# Patient Record
Sex: Male | Born: 1990 | Race: Black or African American | Hispanic: No | Marital: Single | Smoking: Never smoker
Health system: Southern US, Community
[De-identification: ages and names within clinical notes are randomized; demographics above are authoritative.]

## PROBLEM LIST (undated history)

## (undated) DIAGNOSIS — Z8709 Personal history of other diseases of the respiratory system: Secondary | ICD-10-CM

## (undated) DIAGNOSIS — R519 Headache, unspecified: Secondary | ICD-10-CM

## (undated) DIAGNOSIS — R51 Headache: Secondary | ICD-10-CM

## (undated) HISTORY — PX: HUMERUS FRACTURE SURGERY: SHX670

## (undated) HISTORY — PX: ANTERIOR CRUCIATE LIGAMENT REPAIR: SHX115

---

## 2016-02-03 ENCOUNTER — Encounter (HOSPITAL_COMMUNITY): Payer: Self-pay | Admitting: Emergency Medicine

## 2016-02-03 ENCOUNTER — Emergency Department (HOSPITAL_COMMUNITY)
Admission: EM | Admit: 2016-02-03 | Discharge: 2016-02-03 | Disposition: A | Discharge status: Home / Self Care | Payer: BC Managed Care – PPO | Attending: Emergency Medicine | Admitting: Emergency Medicine

## 2016-02-03 ENCOUNTER — Emergency Department (HOSPITAL_COMMUNITY): Payer: BC Managed Care – PPO

## 2016-02-03 DIAGNOSIS — Y998 Other external cause status: Secondary | ICD-10-CM | POA: Insufficient documentation

## 2016-02-03 DIAGNOSIS — Y9372 Activity, wrestling: Secondary | ICD-10-CM | POA: Diagnosis not present

## 2016-02-03 DIAGNOSIS — Y9289 Other specified places as the place of occurrence of the external cause: Secondary | ICD-10-CM | POA: Insufficient documentation

## 2016-02-03 DIAGNOSIS — S46202A Unspecified injury of muscle, fascia and tendon of other parts of biceps, left arm, initial encounter: Secondary | ICD-10-CM | POA: Diagnosis not present

## 2016-02-03 DIAGNOSIS — S4992XA Unspecified injury of left shoulder and upper arm, initial encounter: Secondary | ICD-10-CM | POA: Diagnosis present

## 2016-02-03 DIAGNOSIS — W1839XA Other fall on same level, initial encounter: Secondary | ICD-10-CM | POA: Insufficient documentation

## 2016-02-03 MED ORDER — NAPROXEN 500 MG PO TABS: 500.0000 mg | ORAL_TABLET | Freq: Two times a day (BID) | ORAL | Status: DC

## 2016-02-03 NOTE — Discharge Instructions (Signed)
Tendon Injury Tendons are strong, cordlike structures that connect muscle to bone. Tendons are made up of woven fibers, like a rope. A tendon injury is a tear (rupture) of the tendon. The rupture may be partial (only a few of the fibers in your tendon rupture) or complete (your entire tendon ruptures). CAUSES  Tendon injuries can be caused by high-stress activities, such as sports. They also can be caused by a repetitive injury or by a single injury from an excessive, rapid force. SYMPTOMS  Symptoms of tendon injury include pain when you move the joint close to the tendon. Other symptoms are swelling, redness, and warmth. DIAGNOSIS  Tendon injuries often can be diagnosed by physical exam. However, sometimes an X-ray exam or advanced imaging, such as magnetic resonance imaging (MRI), is necessary to determine the extent of the injury. TREATMENT  Partial tendon ruptures often can be treated with immobilization. A splint, bandage, or removable brace usually is used to immobilize the injured tendon. Most injured tendons need to be immobilized for 1-2 months before they are completely healed. Complete tendon ruptures may require surgical reattachment.   This information is not intended to replace advice given to you by your health care provider. Make sure you discuss any questions you have with your health care provider.   Document Released: 12/07/2004 Document Revised: 10/19/2011 Document Reviewed: 01/21/2012 Elsevier Interactive Patient Education 2016 Elsevier Inc[employer] Cryotherapy Cryotherapy means treatment with cold. Ice or gel packs can be used to reduce both pain and swelling. Ice is the most helpful within the first 24 to 48 hours after an injury or flare-up from overusing a muscle or joint. Sprains, strains, spasms, burning pain, shooting pain, and aches can all be eased with ice. Ice can also be used when recovering from surgery. Ice is effective, has very few side effects, and is safe for most  people to use. PRECAUTIONS  Ice is not a safe treatment option for people with:  Raynaud phenomenon. This is a condition affecting small blood vessels in the extremities. Exposure to cold may cause your problems to return.  Cold hypersensitivity. There are many forms of cold hypersensitivity, including:  Cold urticaria. Red, itchy hives appear on the skin when the tissues begin to warm after being iced.  Cold erythema. This is a red, itchy rash caused by exposure to cold.  Cold hemoglobinuria. Red blood cells break down when the tissues begin to warm after being iced. The hemoglobin that carry oxygen are passed into the urine because they cannot combine with blood proteins fast enough.  Numbness or altered sensitivity in the area being iced. If you have any of the following conditions, do not use ice until you have discussed cryotherapy with your caregiver:  Heart conditions, such as arrhythmia, angina, or chronic heart disease.  High blood pressure.  Healing wounds or open skin in the area being iced.  Current infections.  Rheumatoid arthritis.  Poor circulation.  Diabetes. Ice slows the blood flow in the region it is applied. This is beneficial when trying to stop inflamed tissues from spreading irritating chemicals to surrounding tissues. However, if you expose your skin to cold temperatures for too long or without the proper protection, you can damage your skin or nerves. Watch for signs of skin damage due to cold. HOME CARE INSTRUCTIONS Follow these tips to use ice and cold packs safely.  Place a dry or damp towel between the ice and skin. A damp towel will cool the skin more quickly, so you  may need to shorten the time that the ice is used.  For a more rapid response, add gentle compression to the ice.  Ice for no more than 10 to 20 minutes at a time. The bonier the area you are icing, the less time it will take to get the benefits of ice.  Check your skin after 5  minutes to make sure there are no signs of a poor response to cold or skin damage.  Rest 20 minutes or more between uses.  Once your skin is numb, you can end your treatment. You can test numbness by very lightly touching your skin. The touch should be so light that you do not see the skin dimple from the pressure of your fingertip. When using ice, most people will feel these normal sensations in this order: cold, burning, aching, and numbness.  Do not use ice on someone who cannot communicate their responses to pain, such as small children or people with dementia. HOW TO MAKE AN ICE PACK Ice packs are the most common way to use ice therapy. Other methods include ice massage, ice baths, and cryosprays. Muscle creams that cause a cold, tingly feeling do not offer the same benefits that ice offers and should not be used as a substitute unless recommended by your caregiver. To make an ice pack, do one of the following:  Place crushed ice or a bag of frozen vegetables in a sealable plastic bag. Squeeze out the excess air. Place this bag inside another plastic bag. Slide the bag into a pillowcase or place a damp towel between your skin and the bag.  Mix 3 parts water with 1 part rubbing alcohol. Freeze the mixture in a sealable plastic bag. When you remove the mixture from the freezer, it will be slushy. Squeeze out the excess air. Place this bag inside another plastic bag. Slide the bag into a pillowcase or place a damp towel between your skin and the bag. SEEK MEDICAL CARE IF:  You develop white spots on your skin. This may give the skin a blotchy (mottled) appearance.  Your skin turns blue or pale.  Your skin becomes waxy or hard.  Your swelling gets worse. MAKE SURE YOU:   Understand these instructions.  Will watch your condition.  Will get help right away if you are not doing well or get worse.   This information is not intended to replace advice given to you by your health care  provider. Make sure you discuss any questions you have with your health care provider.   Document Released: 06/26/2011 Document Revised: 11/20/2014 Document Reviewed: 06/26/2011 Elsevier Interactive Patient Education [employer]

## 2016-02-03 NOTE — ED Provider Notes (Signed)
CSN: [id number]     Arrival date & time 02/03/16  [id number] History  By signing my name below, I, Ronney LionSuzanne Le, attest that this documentation has been prepared under the direction and in the presence of Elpidio AnisShari Brookelynn Hamor, PA-C. Electronically Signed: Ronney LionSuzanne Le, ED Scribe. 02/03/2016. 10:15 AM.   Chief Complaint  Patient presents with  . Shoulder Injury   Patient is a [age] y.o. male presenting with shoulder injury. The history is provided by the patient. No language interpreter was used.  Shoulder Injury This is a new problem. The current episode started 12 to 24 hours ago. The problem occurs constantly. Pertinent negatives include no chest pain, no abdominal pain, no headaches and no shortness of breath. The symptoms are aggravated by bending and twisting. Nothing relieves the symptoms. He has tried nothing for the symptoms.   HPI Comments: Dan Keller is a 25 y.o. male who presents to the Emergency Department complaining of sudden-onset, constant, gradually worsening left shoulder pain that onset last night while patient was wrestling and fell on the ground, jamming his shoulder upwards. Patient states he did not take any treatments or medications for his symptoms. Patient states he had [place] in [place] in the past.    History reviewed. No pertinent past medical history. History reviewed. No pertinent past surgical history. No family history on file. Social History  Substance Use Topics  . Smoking status: Never Smoker   . Smokeless tobacco: None  . Alcohol Use: Yes    Review of Systems  Respiratory: Negative for shortness of breath.   Cardiovascular: Negative for chest pain.  Gastrointestinal: Negative for abdominal pain.  Musculoskeletal: Positive for arthralgias (left shoulder pain).  Neurological: Negative for headaches.      Allergies  Review of patient's allergies indicates no known allergies.  Home Medications   Prior to Admission medications   Not on File    BP 120/79 mmHg  Pulse 67  Temp(Src) 98.5 F (36.9 C) (Oral)  Resp 18  Ht 5\' 11"  (1.803 m)  Wt 186 lb (84.369 kg)  BMI 25.95 kg/m2  SpO2 97% Physical Exam  Constitutional: He is oriented to person, place, and time. He appears well-developed and well-nourished. No distress.  HENT:  Head: Normocephalic and atraumatic.  Eyes: Conjunctivae and EOM are normal.  Neck: Neck supple. No tracheal deviation present.  Cardiovascular: Normal rate and intact distal pulses.   Pulmonary/Chest: Effort normal. No respiratory distress.  Musculoskeletal: He exhibits tenderness.  Left shoulder has a palpable soft tissue deformity in the anterior proximal upper extremity. It is tender. He can extend the elbow. He cannot flex at the elbow. He has normal shoulder AC appearance, without tenderness. Distal pulses intact.   Neurological: He is alert and oriented to person, place, and time.  Skin: Skin is warm and dry.  Psychiatric: He has a normal mood and affect. His behavior is normal.  Nursing note and vitals reviewed.   ED Course  Procedures (including critical care time)  DIAGNOSTIC STUDIES: Oxygen Saturation is 97% on RA, normal by my interpretation.    COORDINATION OF CARE: 9:21 AM - Discussed treatment plan with pt at bedside which includes likely referral to orthopedist. Will discuss with attending physician. Pt verbalized understanding and agreed to plan.   9:45 AM - Discussed pt with attending physician Dr. Criss AlvineGoldston, who agrees with plan. Will consult with orthopedist.  Imaging Review No results found. I have personally reviewed and evaluated these images and lab results as part of  my medical decision-making.  MDM   Final diagnoses:  None   1. Left shoulder injury 2. Left biceps rupture/partial rupture  Discussed with Dr. Victorino Dike who advised obtaining plain film of shoulder based on mechanism of trauma. If negative, sling and refer to office to be seen in the next 1-2 days. If  positive, recommends CT of shoulder with office follow up.  Shoulder film negative for evidence of injury. Sling provided. They will contact Dr. Laverta Baltimore office for an appointment per recommendation.  I personally performed the services described in this documentation, which was scribed in my presence. The recorded information has been reviewed and is accurate.        Elpidio Anis, PA-C 02/03/16 1144  Pricilla Loveless, MD 02/06/16 [phone]

## 2016-02-03 NOTE — ED Notes (Signed)
Patient states was "wrestling, playing around" last night around midnight and he felt something tear in his L shoulder.  Patient does have a swollen area to proximal upper extremity.   Patient states didn't take anything for pain.

## 2016-02-07 ENCOUNTER — Other Ambulatory Visit: Payer: BC Managed Care – PPO

## 2016-02-07 ENCOUNTER — Other Ambulatory Visit: Payer: Self-pay | Admitting: Orthopedic Surgery

## 2016-02-07 ENCOUNTER — Ambulatory Visit
Admission: RE | Admit: 2016-02-07 | Discharge: 2016-02-07 | Disposition: A | Discharge status: Home / Self Care | Payer: BC Managed Care – PPO | Source: Ambulatory Visit | Attending: Orthopedic Surgery | Admitting: Orthopedic Surgery

## 2016-02-07 DIAGNOSIS — S46912A Strain of unspecified muscle, fascia and tendon at shoulder and upper arm level, left arm, initial encounter: Secondary | ICD-10-CM

## 2016-02-07 DIAGNOSIS — S29011A Strain of muscle and tendon of front wall of thorax, initial encounter: Secondary | ICD-10-CM

## 2016-02-15 NOTE — Pre-Procedure Instructions (Addendum)
    Dan Keller  02/15/2016      [facility] [id number] - [place], [place] - [address] [place] [id number] Phone: [phone] Fax: [phone]    Your procedure is scheduled on Thursday, April 6.  [facility] Ad[facility] at 9:55 A.M.                 Your surgery or procedure is scheduled for 11:55 AM    Call this number if you have problems the morning of surgery:[phone]   Remember:  Do not eat food or drink liquids after midnight.   Take these medicines the morning of surgery with A SIP OF WATER :None   Do not wear jewelry.  Do not wear lotions, powders, or colognes.    Men may shave face and neck.  Do not bring valuables to the hospital.  [facility] is not responsible for any belongings or valuables.  Contacts, dentures or bridgework may not be worn into surgery.  Leave your suitcase in the car.  After surgery it may be brought to your room.  For patients admitted to the hospital, discharge time will be determined by your treatment team.  Patients discharged the day of surgery will not be allowed to drive home.    Special instructions:  Review  [facility] - Preparing For Surgery.  Please read over the following fact sheets that you were given. Pain Booklet, Coughing and Deep Breathing and Surgical Site Infection Prevention and Incentive Spirometery

## 2016-02-16 ENCOUNTER — Encounter (HOSPITAL_COMMUNITY): Payer: Self-pay

## 2016-02-16 ENCOUNTER — Encounter (HOSPITAL_COMMUNITY)
Admission: RE | Admit: 2016-02-16 | Discharge: 2016-02-16 | Disposition: A | Discharge status: Home / Self Care | Payer: BC Managed Care – PPO | Source: Ambulatory Visit | Attending: Orthopedic Surgery | Admitting: Orthopedic Surgery

## 2016-02-16 DIAGNOSIS — S46812A Strain of other muscles, fascia and tendons at shoulder and upper arm level, left arm, initial encounter: Secondary | ICD-10-CM | POA: Diagnosis not present

## 2016-02-16 DIAGNOSIS — W19XXXA Unspecified fall, initial encounter: Secondary | ICD-10-CM | POA: Diagnosis not present

## 2016-02-16 DIAGNOSIS — Y9372 Activity, wrestling: Secondary | ICD-10-CM | POA: Diagnosis not present

## 2016-02-16 DIAGNOSIS — Z01812 Encounter for preprocedural laboratory examination: Secondary | ICD-10-CM | POA: Insufficient documentation

## 2016-02-16 HISTORY — DX: Headache: R51

## 2016-02-16 HISTORY — DX: Headache, unspecified: R51.9

## 2016-02-16 HISTORY — DX: Personal history of other diseases of the respiratory system: Z87.09

## 2016-02-16 LAB — CBC
HEMATOCRIT: 42.6 % (ref 39.0–52.0)
Hemoglobin: 15.2 g/dL (ref 13.0–17.0)
MCH: 30.8 pg (ref 26.0–34.0)
MCHC: 35.7 g/dL (ref 30.0–36.0)
MCV: 86.2 fL (ref 78.0–100.0)
Platelets: 195 10*3/uL (ref 150–400)
RBC: 4.94 MIL/uL (ref 4.22–5.81)
RDW: 12.3 % (ref 11.5–15.5)
WBC: 3.4 10*3/uL — AB (ref 4.0–10.5)

## 2016-02-16 NOTE — Progress Notes (Signed)
PCP - denies Cardiologist - denies   EKG/CXR - denies Echo/stress test/cardiac cath - denies  Patient denies chest pain and shortness of breath at PAT appointment.   

## 2016-02-17 ENCOUNTER — Ambulatory Visit (HOSPITAL_COMMUNITY)
Admission: RE | Admit: 2016-02-17 | Discharge: 2016-02-17 | Disposition: A | Discharge status: Home / Self Care | Payer: BC Managed Care – PPO | Source: Ambulatory Visit | Attending: Orthopedic Surgery | Admitting: Orthopedic Surgery

## 2016-02-17 ENCOUNTER — Ambulatory Visit (HOSPITAL_COMMUNITY): Payer: BC Managed Care – PPO | Admitting: Anesthesiology

## 2016-02-17 ENCOUNTER — Encounter (HOSPITAL_COMMUNITY): Payer: Self-pay | Admitting: *Deleted

## 2016-02-17 ENCOUNTER — Encounter (HOSPITAL_COMMUNITY)
Admission: RE | Disposition: A | Discharge status: Home / Self Care | Payer: Self-pay | Source: Ambulatory Visit | Attending: Orthopedic Surgery

## 2016-02-17 DIAGNOSIS — X58XXXA Exposure to other specified factors, initial encounter: Secondary | ICD-10-CM | POA: Insufficient documentation

## 2016-02-17 DIAGNOSIS — S46812A Strain of other muscles, fascia and tendons at shoulder and upper arm level, left arm, initial encounter: Secondary | ICD-10-CM | POA: Insufficient documentation

## 2016-02-17 HISTORY — PX: PECTORALIS TENDON REPAIR: SHX6510

## 2016-02-17 SURGERY — REPAIR, TENDON, PECTORALIS
Anesthesia: General | Site: Shoulder | Laterality: Left

## 2016-02-17 MED ORDER — FENTANYL CITRATE (PF) 250 MCG/5ML IJ SOLN
INTRAMUSCULAR | Status: AC
  Filled 2016-02-17: qty 5

## 2016-02-17 MED ORDER — BUPIVACAINE HCL (PF) 0.25 % IJ SOLN
INTRAMUSCULAR | Status: AC
  Filled 2016-02-17: qty 30

## 2016-02-17 MED ORDER — FENTANYL CITRATE (PF) 100 MCG/2ML IJ SOLN
INTRAMUSCULAR | Status: DC | PRN
  Administered 2016-02-17: 50 ug via INTRAVENOUS

## 2016-02-17 MED ORDER — ONDANSETRON HCL 4 MG PO TABS: 4.0000 mg | ORAL_TABLET | Freq: Three times a day (TID) | ORAL | Status: DC | PRN

## 2016-02-17 MED ORDER — ONDANSETRON HCL 4 MG/2ML IJ SOLN: 4.0000 mg | Freq: Once | INTRAMUSCULAR | Status: DC

## 2016-02-17 MED ORDER — ROCURONIUM BROMIDE 50 MG/5ML IV SOLN
INTRAVENOUS | Status: AC
  Filled 2016-02-17: qty 1

## 2016-02-17 MED ORDER — 0.9 % SODIUM CHLORIDE (POUR BTL) OPTIME
TOPICAL | Status: DC | PRN
  Administered 2016-02-17: 1000 mL

## 2016-02-17 MED ORDER — DIAZEPAM 5 MG PO TABS: 2.5000 mg | ORAL_TABLET | Freq: Four times a day (QID) | ORAL | Status: DC | PRN

## 2016-02-17 MED ORDER — GLYCOPYRROLATE 0.2 MG/ML IJ SOLN
INTRAMUSCULAR | Status: AC
  Filled 2016-02-17: qty 2

## 2016-02-17 MED ORDER — PROPOFOL 10 MG/ML IV BOLUS
INTRAVENOUS | Status: DC | PRN
  Administered 2016-02-17: 200 mg via INTRAVENOUS

## 2016-02-17 MED ORDER — ROCURONIUM BROMIDE 100 MG/10ML IV SOLN
INTRAVENOUS | Status: DC | PRN
  Administered 2016-02-17: 50 mg via INTRAVENOUS

## 2016-02-17 MED ORDER — LIDOCAINE HCL (CARDIAC) 20 MG/ML IV SOLN
INTRAVENOUS | Status: AC
  Filled 2016-02-17: qty 10

## 2016-02-17 MED ORDER — BUPIVACAINE-EPINEPHRINE (PF) 0.5% -1:200000 IJ SOLN
INTRAMUSCULAR | Status: DC | PRN
  Administered 2016-02-17: 30 mL via PERINEURAL

## 2016-02-17 MED ORDER — CHLORHEXIDINE GLUCONATE 4 % EX LIQD: 60.0000 mL | Freq: Once | CUTANEOUS | Status: DC

## 2016-02-17 MED ORDER — FENTANYL CITRATE (PF) 100 MCG/2ML IJ SOLN
INTRAMUSCULAR | Status: AC
  Administered 2016-02-17: 100 ug
  Filled 2016-02-17: qty 2

## 2016-02-17 MED ORDER — MIDAZOLAM HCL 2 MG/2ML IJ SOLN
INTRAMUSCULAR | Status: AC
  Administered 2016-02-17: 2 mg
  Filled 2016-02-17: qty 2

## 2016-02-17 MED ORDER — OXYCODONE-ACETAMINOPHEN 5-325 MG PO TABS: 1.0000 | ORAL_TABLET | ORAL | Status: DC | PRN

## 2016-02-17 MED ORDER — LIDOCAINE HCL (CARDIAC) 20 MG/ML IV SOLN
INTRAVENOUS | Status: DC | PRN
Start: 2016-02-17 — End: 2016-02-17
  Administered 2016-02-17: 80 mg via INTRAVENOUS

## 2016-02-17 MED ORDER — NEOSTIGMINE METHYLSULFATE 10 MG/10ML IV SOLN
INTRAVENOUS | Status: DC | PRN
  Administered 2016-02-17: 3 mg via INTRAVENOUS

## 2016-02-17 MED ORDER — GLYCOPYRROLATE 0.2 MG/ML IJ SOLN
INTRAMUSCULAR | Status: DC | PRN
  Administered 2016-02-17: 0.4 mg via INTRAVENOUS

## 2016-02-17 MED ORDER — CEFAZOLIN SODIUM-DEXTROSE 2-4 GM/100ML-% IV SOLN
2.0000 g | INTRAVENOUS | Status: AC
  Administered 2016-02-17: 2 g via INTRAVENOUS
  Filled 2016-02-17: qty 100

## 2016-02-17 MED ORDER — LACTATED RINGERS IV SOLN
INTRAVENOUS | Status: DC
  Administered 2016-02-17: 10:00:00 via INTRAVENOUS

## 2016-02-17 MED ORDER — PROPOFOL 10 MG/ML IV BOLUS
INTRAVENOUS | Status: AC
  Filled 2016-02-17: qty 20

## 2016-02-17 MED ORDER — MIDAZOLAM HCL 2 MG/2ML IJ SOLN
INTRAMUSCULAR | Status: AC
  Filled 2016-02-17: qty 2

## 2016-02-17 MED ORDER — ONDANSETRON HCL 4 MG/2ML IJ SOLN
INTRAMUSCULAR | Status: AC
  Filled 2016-02-17: qty 4

## 2016-02-17 MED ORDER — HYDROMORPHONE HCL 1 MG/ML IJ SOLN: 0.2500 mg | INTRAMUSCULAR | Status: DC | PRN

## 2016-02-17 MED ORDER — ONDANSETRON HCL 4 MG/2ML IJ SOLN
INTRAMUSCULAR | Status: DC | PRN
  Administered 2016-02-17: 4 mg via INTRAVENOUS

## 2016-02-17 SURGICAL SUPPLY — 66 items
BANDAGE ELASTIC 4 VELCRO ST LF (GAUZE/BANDAGES/DRESSINGS) IMPLANT
BNDG COHESIVE 4X5 TAN STRL (GAUZE/BANDAGES/DRESSINGS) ×3 IMPLANT
BNDG ESMARK 4X9 LF (GAUZE/BANDAGES/DRESSINGS) IMPLANT
BNDG GAUZE ELAST 4 BULKY (GAUZE/BANDAGES/DRESSINGS) IMPLANT
BUPIVACAINE 0.25% PL 30ML IMPLANT
CLOSURE STERI-STRIP 1/2X4 (GAUZE/BANDAGES/DRESSINGS) ×1
CLOSURE WOUND 1/2 X4 (GAUZE/BANDAGES/DRESSINGS) ×1
CLSR STERI-STRIP ANTIMIC 1/2X4 (GAUZE/BANDAGES/DRESSINGS) ×2 IMPLANT
COVER SURGICAL LIGHT HANDLE (MISCELLANEOUS) ×3 IMPLANT
CUFF TOURNIQUET SINGLE 34IN LL (TOURNIQUET CUFF) IMPLANT
CUFF TOURNIQUET SINGLE 44IN (TOURNIQUET CUFF) IMPLANT
DERMABOND ADVANCED (GAUZE/BANDAGES/DRESSINGS) ×2
DERMABOND ADVANCED .7 DNX12 (GAUZE/BANDAGES/DRESSINGS) ×1 IMPLANT
DRAPE INCISE IOBAN 66X45 STRL (DRAPES) ×3 IMPLANT
DRAPE ORTHO SPLIT 77X108 STRL (DRAPES) ×4
DRAPE SURG ORHT 6 SPLT 77X108 (DRAPES) ×2 IMPLANT
DRAPE U-SHAPE 47X51 STRL (DRAPES) ×3 IMPLANT
DRSG ADAPTIC 3X8 NADH LF (GAUZE/BANDAGES/DRESSINGS) ×3 IMPLANT
DRSG AQUACEL AG ADV 3.5X 6 (GAUZE/BANDAGES/DRESSINGS) ×3 IMPLANT
DURAPREP 26ML APPLICATOR (WOUND CARE) ×3 IMPLANT
ELECT CAUTERY BLADE 6.4 (BLADE) ×3 IMPLANT
ELECT REM PT RETURN 9FT ADLT (ELECTROSURGICAL) ×3
ELECTRODE REM PT RTRN 9FT ADLT (ELECTROSURGICAL) ×1 IMPLANT
GAUZE SPONGE 4X4 12PLY STRL (GAUZE/BANDAGES/DRESSINGS) IMPLANT
GLOVE BIO SURGEON STRL SZ7.5 (GLOVE) ×3 IMPLANT
GLOVE BIO SURGEON STRL SZ8 (GLOVE) ×3 IMPLANT
GLOVE EUDERMIC 7 POWDERFREE (GLOVE) ×3 IMPLANT
GLOVE SS BIOGEL STRL SZ 7.5 (GLOVE) ×1 IMPLANT
GLOVE SUPERSENSE BIOGEL SZ 7.5 (GLOVE) ×2
GOWN STRL REUS W/ TWL LRG LVL3 (GOWN DISPOSABLE) ×1 IMPLANT
GOWN STRL REUS W/ TWL XL LVL3 (GOWN DISPOSABLE) ×2 IMPLANT
GOWN STRL REUS W/TWL LRG LVL3 (GOWN DISPOSABLE) ×2
GOWN STRL REUS W/TWL XL LVL3 (GOWN DISPOSABLE) ×4
IMPL KIT LG PECW/BUTTON 3PK (Orthopedic Implant) ×1 IMPLANT
IMPLANT KIT LG PECW/BUTTON 3PK (Orthopedic Implant) ×3 IMPLANT
KIT BASIN OR (CUSTOM PROCEDURE TRAY) ×3 IMPLANT
KIT ROOM TURNOVER OR (KITS) ×3 IMPLANT
MANIFOLD NEPTUNE II (INSTRUMENTS) ×3 IMPLANT
NEEDLE 22X1 1/2 (OR ONLY) (NEEDLE) IMPLANT
NS IRRIG 1000ML POUR BTL (IV SOLUTION) ×3 IMPLANT
PACK ORTHO EXTREMITY (CUSTOM PROCEDURE TRAY) ×3 IMPLANT
PAD ARMBOARD 7.5X6 YLW CONV (MISCELLANEOUS) ×6 IMPLANT
PAD CAST 3X4 CTTN HI CHSV (CAST SUPPLIES) IMPLANT
PADDING CAST COTTON 3X4 STRL (CAST SUPPLIES)
PASSER SUT SWANSON 36MM LOOP (INSTRUMENTS) ×6 IMPLANT
SLING ARM FOAM STRAP LRG (SOFTGOODS) ×3 IMPLANT
SPONGE LAP 4X18 X RAY DECT (DISPOSABLE) ×6 IMPLANT
STAPLER VISISTAT 35W (STAPLE) IMPLANT
STOCKINETTE IMPERVIOUS 9X36 MD (GAUZE/BANDAGES/DRESSINGS) ×3 IMPLANT
STRIP CLOSURE SKIN 1/2X4 (GAUZE/BANDAGES/DRESSINGS) ×2 IMPLANT
SUCTION FRAZIER HANDLE 10FR (MISCELLANEOUS) ×2
SUCTION TUBE FRAZIER 10FR DISP (MISCELLANEOUS) ×1 IMPLANT
SUT ETHIBOND 2 OS 4 DA (SUTURE) ×3 IMPLANT
SUT FIBERWIRE #2 38 T-5 BLUE (SUTURE) ×3
SUT MNCRL AB 3-0 PS2 18 (SUTURE) ×3 IMPLANT
SUT MON AB 2-0 CT1 36 (SUTURE) ×3 IMPLANT
SUT VIC AB 2-0 CT1 27 (SUTURE) ×2
SUT VIC AB 2-0 CT1 TAPERPNT 27 (SUTURE) ×1 IMPLANT
SUTURE FIBERWR #2 38 T-5 BLUE (SUTURE) ×1 IMPLANT
SYR CONTROL 10ML LL (SYRINGE) IMPLANT
TOWEL OR 17X24 6PK STRL BLUE (TOWEL DISPOSABLE) ×3 IMPLANT
TOWEL OR 17X26 10 PK STRL BLUE (TOWEL DISPOSABLE) ×3 IMPLANT
TUBE CONNECTING 12'X1/4 (SUCTIONS) ×1
TUBE CONNECTING 12X1/4 (SUCTIONS) ×2 IMPLANT
WATER STERILE IRR 1000ML POUR (IV SOLUTION) ×3 IMPLANT
YANKAUER SUCT BULB TIP NO VENT (SUCTIONS) IMPLANT

## 2016-02-17 NOTE — Transfer of Care (Signed)
Immediate Anesthesia Transfer of Care Note  Patient: Dan Keller  Procedure(s) Performed: Procedure(s): LEFT PECTORALIS TENDON REPAIR (Left)  Patient Location: PACU  Anesthesia Type:GA combined with regional for post-op pain  Level of Consciousness: awake, alert  and oriented  Airway & Oxygen Therapy: Patient Spontanous Breathing and Patient connected to nasal cannula oxygen  Post-op Assessment: Report given to RN, Post -op Vital signs reviewed and stable and Patient moving all extremities  Post vital signs: Reviewed and stable  Last Vitals:  Filed Vitals:   02/17/16 1050 02/17/16 1100  BP:    Pulse:    Temp:    Resp: 16 20    Complications: No apparent anesthesia complications

## 2016-02-17 NOTE — Discharge Instructions (Signed)
You may shower with current dressing in place, it is waterproof. Leave it in place until follow up appointment Ok to allow arm to dangle and to move it gently for hygiene. Sling on but can remove to allow arm to dangle and move elbow wrist and hand.    Call [phone] for any questions or concerns

## 2016-02-17 NOTE — Op Note (Signed)
02/17/2016  12:57 PM  PATIENT:   Dan Keller  25 y.o. male  PRE-OPERATIVE DIAGNOSIS:  left pectoralis tendon rupture  POST-OPERATIVE DIAGNOSIS:  same  PROCEDURE:  L pectoralis major tendon repair  SURGEON:  Yadir Zentner, Vania ReaKevin M. M.D.  ASSISTANTS: Shuford pac   ANESTHESIA:   GET + ISB  EBL: min  SPECIMEN:  none  Drains: none   PATIENT DISPOSITION:  PACU - hemodynamically stable.    PLAN OF CARE: Discharge to home after PACU  Dictation# [id number]   Contact # [phone]

## 2016-02-17 NOTE — Anesthesia Procedure Notes (Addendum)
Anesthesia Regional Block:  Interscalene brachial plexus block  Pre-Anesthetic Checklist: ,, timeout performed, Correct Patient, Correct Site, Correct Laterality, Correct Procedure, Correct Position, site marked, Risks and benefits discussed,  Surgical consent,  Pre-op evaluation,  At surgeon's request and post-op pain management  Laterality: Upper and Left  Prep: chloraprep and alcohol swabs       Needles:  Injection technique: Single-shot  Needle Type: Stimulator Needle - 40     Needle Length: 4cm 4 cm Needle Gauge: 22 and 22 G  Needle insertion depth: 3 cm   Additional Needles:  Procedures: ultrasound guided (picture in chart) and nerve stimulator Interscalene brachial plexus block  Nerve Stimulator or Paresthesia:  Response: Twitch elicited, 0.5 mA, 0.3 ms,   Additional Responses:   Narrative:  Start time: 02/17/2016 10:45 AM End time: 02/17/2016 11:05 AM Injection made incrementally with aspirations every 5 mL.  Performed by: Personally  Anesthesiologist: MASSAGEE, TERRY  Additional Notes: Block assessed prior to start of surgery   Procedure Name: Intubation Date/Time: 02/17/2016 11:34 AM Performed by: Sharlene DoryWALKER, Bleu Minerd E Pre-anesthesia Checklist: Patient identified, Emergency Drugs available, Suction available, Patient being monitored and Timeout performed Patient Re-evaluated:Patient Re-evaluated prior to inductionOxygen Delivery Method: Circle system utilized Preoxygenation: Pre-oxygenation with 100% oxygen Intubation Type: IV induction Ventilation: Mask ventilation without difficulty Laryngoscope Size: Mac and 4 Grade View: Grade I Tube type: Oral Tube size: 7.5 mm Number of attempts: 1 Airway Equipment and Method: Stylet Placement Confirmation: ETT inserted through vocal cords under direct vision,  positive ETCO2 and breath sounds checked- equal and bilateral Secured at: 22 cm Tube secured with: Tape Dental Injury: Teeth and Oropharynx as per pre-operative  assessment

## 2016-02-17 NOTE — Anesthesia Preprocedure Evaluation (Addendum)
Anesthesia Evaluation  Patient identified by MRN, date of birth, ID band Patient awake    Reviewed: Allergy & Precautions, NPO status , Patient's Chart, lab work & pertinent test results  History of Anesthesia Complications Negative for: history of anesthetic complications  Airway Mallampati: I       Dental  (+) Teeth Intact   Pulmonary neg pulmonary ROS,    breath sounds clear to auscultation       Cardiovascular negative cardio ROS   Rhythm:Regular Rate:Normal     Neuro/Psych negative neurological ROS  negative psych ROS   GI/Hepatic negative GI ROS, Neg liver ROS,   Endo/Other  negative endocrine ROS  Renal/GU negative Renal ROS  negative genitourinary   Musculoskeletal negative musculoskeletal ROS (+)   Abdominal   Peds negative pediatric ROS (+)  Hematology negative hematology ROS (+)   Anesthesia Other Findings   Reproductive/Obstetrics negative OB ROS                             Anesthesia Physical Anesthesia Plan  ASA: I  Anesthesia Plan: General   Post-op Pain Management: GA combined w/ Regional for post-op pain   Induction: Intravenous  Airway Management Planned: Oral ETT  Additional Equipment:   Intra-op Plan:   Post-operative Plan: Extubation in OR  Informed Consent:   Dental advisory given  Plan Discussed with: CRNA and Surgeon  Anesthesia Plan Comments:         Anesthesia Quick Evaluation

## 2016-02-17 NOTE — H&P (Signed)
Dan Keller    Chief Complaint: left pectoralis tendon rupture HPI: The patient is a 25 y.o. male with an acute left pectoralis major tendon rupture  Past Medical History  Diagnosis Date  . History of bronchitis   . Headache     "occasionally - not recently" 02/16/16    Past Surgical History  Procedure Laterality Date  . Anterior cruciate ligament repair Left   . Humerus fracture surgery Right     History reviewed. No pertinent family history.  Social History:  reports that he has never smoked. He does not have any smokeless tobacco history on file. He reports that he uses illicit drugs (Marijuana). He reports that he does not drink alcohol.   Medications Prior to Admission  Medication Sig Dispense Refill  . naproxen (NAPROSYN) 500 MG tablet Take 500 mg by mouth 2 (two) times daily with a meal.       Physical Exam: left shoulder with loss of pec major contour and weakness with internal rotation  Vitals  Temp:  [98.4 F (36.9 C)] 98.4 F (36.9 C) (04/06 1003) Pulse Rate:  [58] 58 (04/06 1034) Resp:  [18] 18 (04/06 1034) BP: (127)/(77) 127/77 mmHg (04/06 1003) SpO2:  [99 %-100 %] 100 % (04/06 1034) Weight:  [84.114 kg (185 lb 7 oz)] 84.114 kg (185 lb 7 oz) (04/06 0947)  Assessment/Plan  Impression: left pectoralis tendon rupture  Plan of Action: Procedure(s): LEFT PECTORALIS TENDON REPAIR  Avory Rahimi M Dontavius Keim 02/17/2016, 10:56 AM Contact # [phone]

## 2016-02-18 ENCOUNTER — Encounter (HOSPITAL_COMMUNITY): Payer: Self-pay | Admitting: Orthopedic Surgery

## 2016-02-18 NOTE — Op Note (Signed)
NAMMarland Kitchen:  Dan Keller, Dan Keller               ACCOUNT NO.:  [id number]  MEDICAL RECORD NO.:  [id number]  LOCATION:  [facility]                         FACILITY:  [facility]  PHYSICIAN:  Vania ReaKevin M. Canda Podgorski, M.D.  DATE OF BIRTH:  [birth year]  DATE OF PROCEDURE:  02/17/2016 DATE OF DISCHARGE:  02/17/2016                              OPERATIVE REPORT   PREOPERATIVE DIAGNOSIS:  Left pectoralis major tendon rupture.  POSTOPERATIVE DIAGNOSIS:  Left pectoralis major tendon rupture.  PROCEDURE:  Open left pectoralis major tendon repair.  SURGEON:  Vania ReaKevin M. Giovannie Scerbo, M.D.  Threasa HeadsASSISTANFrench Ana:  Tracy A. Shuford, P.A.-C.  ANESTHESIA:  General endotracheal as well as an interscalene block.  ESTIMATED BLOOD LOSS:  Minimal.  DRAINS:  None.  HISTORY:  Dan Keller is a 25 year old male, who injured his left shoulder with complaints of pain, weakness and muscular deformity on anterolateral aspect of his left chest wall in the region of the anterior axillary fold.  On evaluation in our office, known to have some asymmetry of the pectoralis major muscle belly contours with change in the contour of the axillary fold, local tenderness about the anterior aspect of the shoulder and significant pain and weakness with attempts at resisted internal rotation.  A subsequent chest wall MRI scan confirmed a rupture of the pectoralis major tendon insertion distally. He was brought to the operating room at this time for planned pectoralis major tendon repair.  Preoperatively, I counseled Dan Keller regarding treatment options and potential risks versus benefits thereof.  Possible surgical complications were reviewed including bleeding, infection, vascular injury, persistent pain, loss of motion, recurrent rupture, anesthetic complication and possible need for additional surgery.  He understands and accepts and agrees with our planned procedure.  PROCEDURE IN DETAIL:  After undergoing routine preop evaluation, the patient received prophylactic  antibiotics.  An interscalene block was established in the holding area of the Anesthesia Department.  Placed supine on the operating table, underwent smooth induction of a general endotracheal anesthesia.  Bump was placed between the shoulder blades, and at this point, then the left upper extremity, left shoulder girdle was sterilely prepped and draped in standard fashion.  Time-out was called.  I made an anterior longitudinal 4-cm incision over the left shoulder at the lower border of the deltoid and along the line of the long head and short head biceps muscle bellies.  Skin flaps were elevated, dissection carried deeply.  Deep fascia was then divided. Upon inspection, we found that the deltopectoral relationship more proximally had been maintained and surface of the clavicular head of the pectoralis was intact, but as we dissected more inferiorly, we could see that it was the sternal head that had been ruptured.  We carried our dissection laterally and identified the avulsion of the tendon from the anterior aspect of the humeral shaft just lateral to the long-head biceps tendon and this area was scarified with an osteotome and rongeur to a bleeding bed.  We then carried our dissection medially and we were able to identify the torn and retracted tendinous portion of the sternal head of the pec major and this was then mobilized and we passed a series of whipstitches using #2 FiberWire in a grasping suture  technique, total of four sutures were passed, given as eight suture limbs and excellent purchase to the tendon of the pectoralis.  We confirmed proper mobility. At this point, we then drilled two 3.5 drill holes into the humeral cortex on the very lateral aspect of the bony bed as we prepared and this again was lateral to the long-head biceps tendon.  We then passed our suture limbs through two of the Arthrex pectoralis major tendon repair buttons such that a total of four suture limbs  were passed through each button in a crossing pattern.  The buttons were then delivered into the bone tunnels that we had created and then we tensioned all of the sutures such that it allowed [place] to bring the pec major tendon directly into contact with the bony bed we created on the humerus.  The suture limbs were then passed and back through the pec major tendon with a free needle in a horizontal mattress pattern and all the pairs of sutures were then terminally tensioned and then tied with multiple overhand throws.  The suture limbs were all then clipped.  At this point, we confirmed the stability of our repair and were very pleased with the overall construct.  The wound was then irrigated.  The deep fascial layer was then reapproximated with 2-0 Vicryl, intracuticular 3-0 Monocryl was used to close the skin followed by Dermabond and an Aquacel dressing.  Left arm was placed into a sling. The patient was awakened, extubated, and taken to the recovery room in stable condition.  Lucita Lora Shuford, PA-C was used as an Geophysicist/field seismologist throughout this case, was essential for help with positioning the patient, positioning the extremity, tissue manipulation, retraction, wound closure, and intraoperative decision making.     Vania Rea. Shreyansh Tiffany, M.D.     KMS/MEDQ  D:  02/17/2016  T:  02/18/2016  Job:  [id number]

## 2016-02-22 NOTE — Anesthesia Postprocedure Evaluation (Signed)
Anesthesia Post Note  Patient: Dan Keller  Procedure(s) Performed: Procedure(s) (LRB): LEFT PECTORALIS TENDON REPAIR (Left)  Patient location during evaluation: PACU Anesthesia Type: General and Regional Level of consciousness: awake and alert Pain management: pain level controlled Vital Signs Assessment: post-procedure vital signs reviewed and stable Respiratory status: spontaneous breathing, nonlabored ventilation, respiratory function stable and patient connected to nasal cannula oxygen Cardiovascular status: blood pressure returned to baseline and stable Postop Assessment: no signs of nausea or vomiting Anesthetic complications: no    Last Vitals:  Filed Vitals:   02/17/16 1350 02/17/16 1358  BP: 119/80 120/82  Pulse:    Temp: 36.7 C   Resp:      Last Pain:  Filed Vitals:   02/17/16 1400  PainSc: 0-No pain                 Augustino Savastano,JAMES TERRILL

## 2016-10-28 IMAGING — DX DG SHOULDER 2+V*L*
2 series · 2 of 2 positions shown · non-contrast
Comparison: None.

CLINICAL DATA: Pain following fall

EXAM:
LEFT SHOULDER - 2+ VIEW

[shoulder y view]
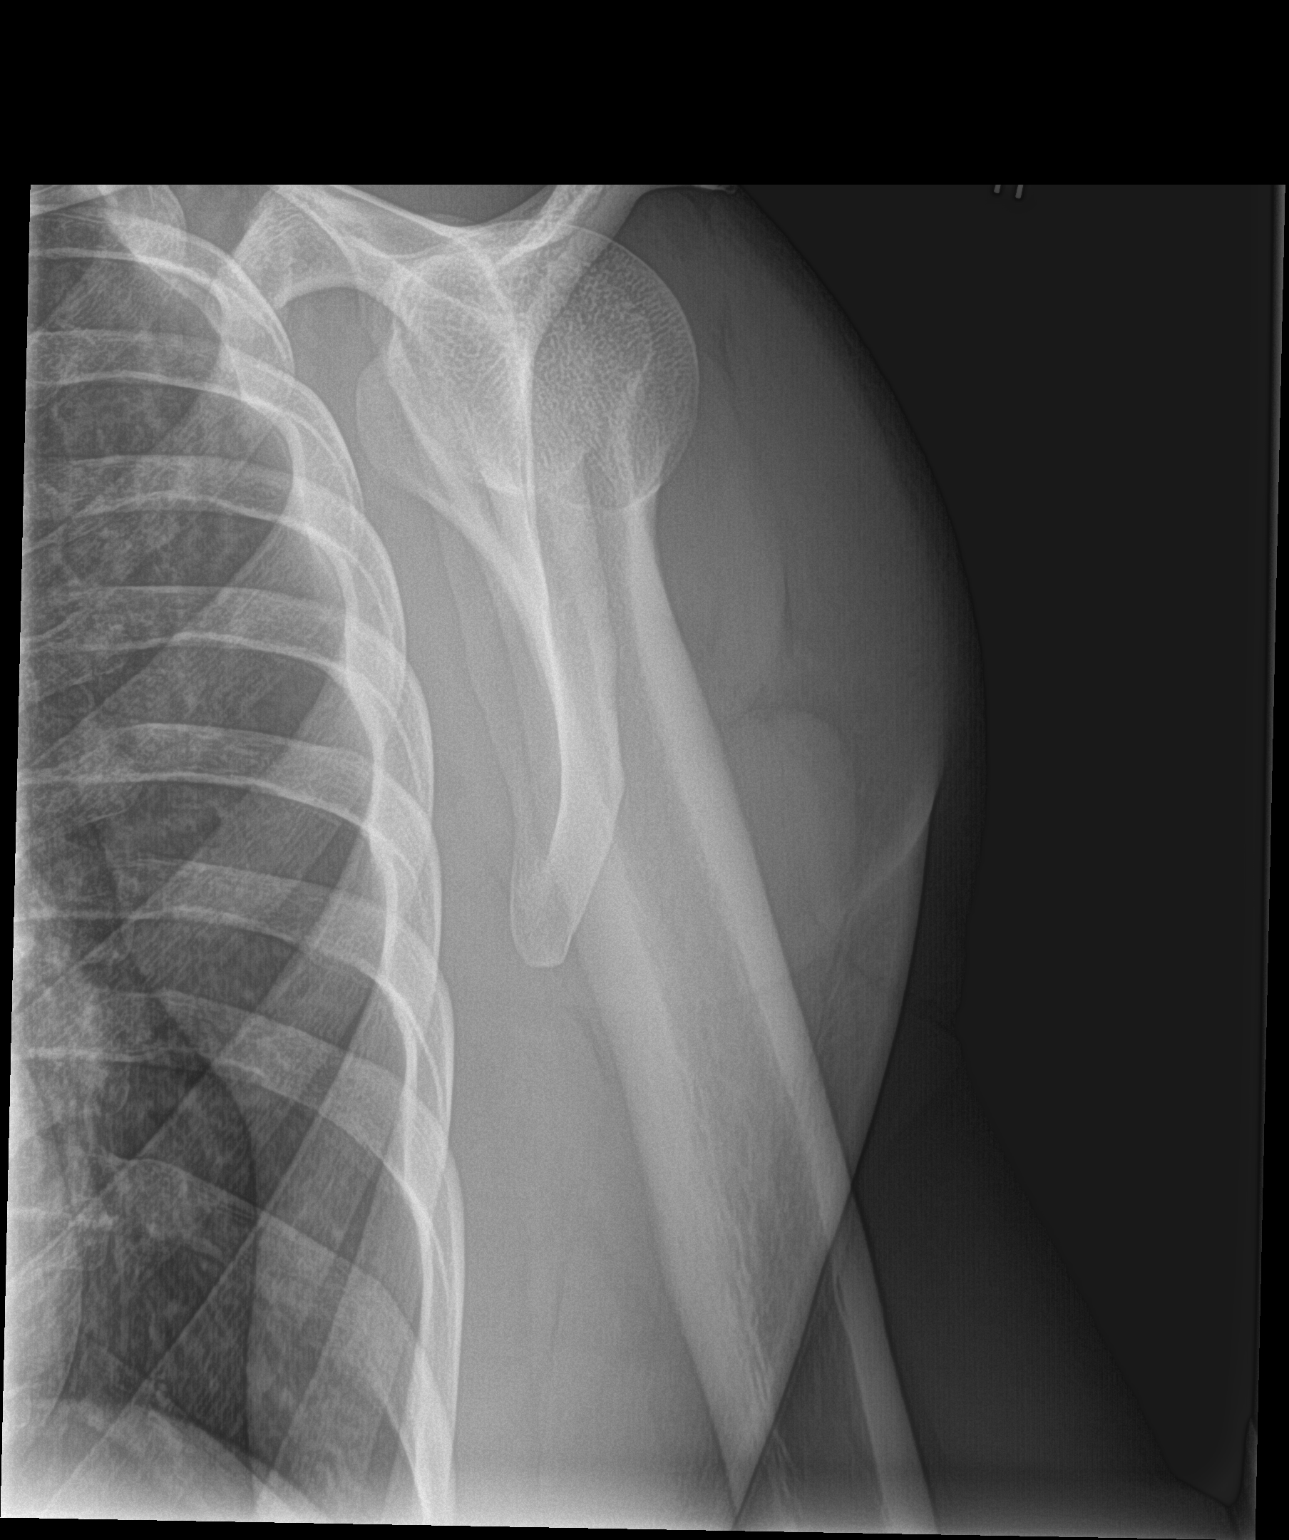

[shoulder grashey]
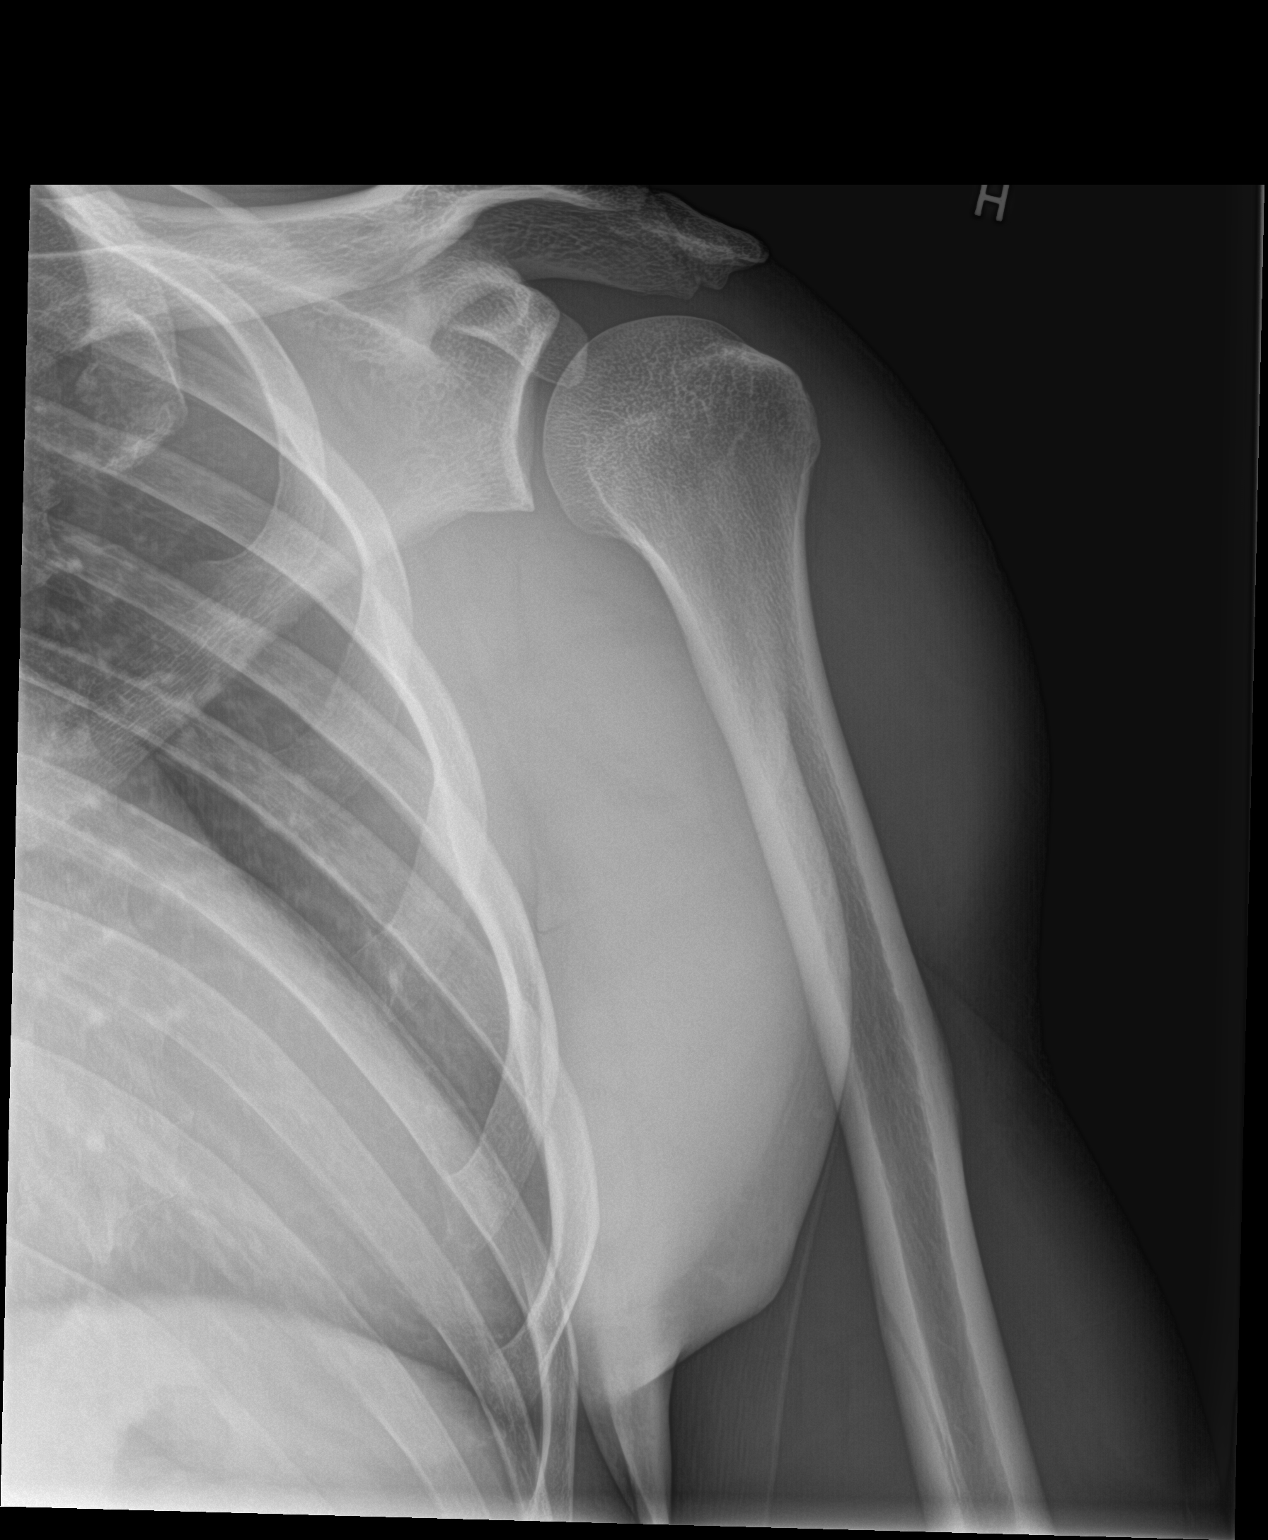

[2 of 2 positions shown; findings below may reference images not displayed]

FINDINGS: Oblique and Y scapular images were obtained. There is no fracture or
dislocation. Joint spaces appear normal. No erosive change or
intra-articular calcification.
IMPRESSION: No fracture or dislocation.  No apparent arthropathy.

## 2016-11-01 IMAGING — MR MR CHEST MEDIASTINUM W/O CM
5 series · 16 of 16 positions shown · non-contrast
Comparison: None.

CLINICAL DATA: Fell 4 days ago and injured shoulder. Clinical
suspicion for a pectoralis major rupture.

EXAM:
MRI CHEST WITHOUT CONTRAST
TECHNIQUE: Multiplanar, multisequence MR imaging was performed. No intravenous
contrast was administered.

[Series 3: T1 · axial · 6.5mm · 0.47mm/px · z∈[-109,+153]mm · 3 of 32 slices shown (1 of 2)]
[im 1/32]
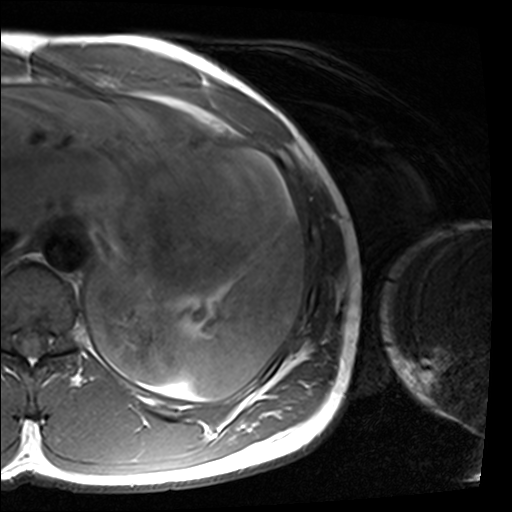
[im 16/32]
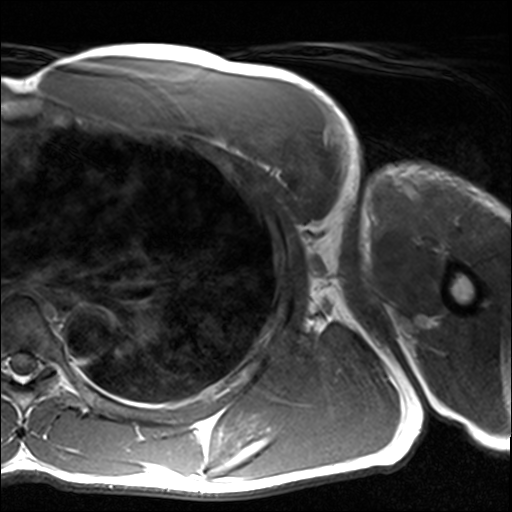
[im 32/32]
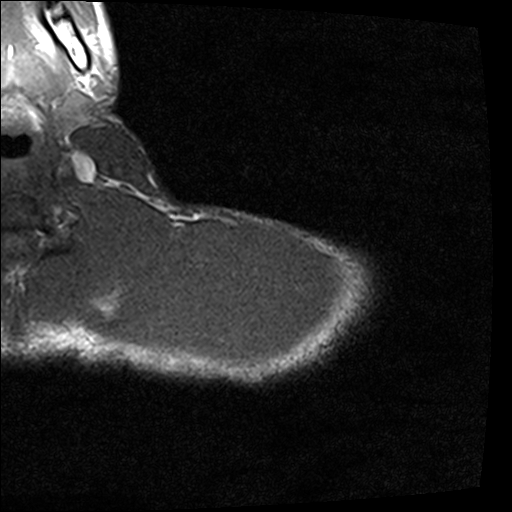

[Series 4: t2_ax fs · axial · 6.5mm · 0.47mm/px · z∈[-109,+153]mm · 3 of 32 slices shown]
[im 1/32]
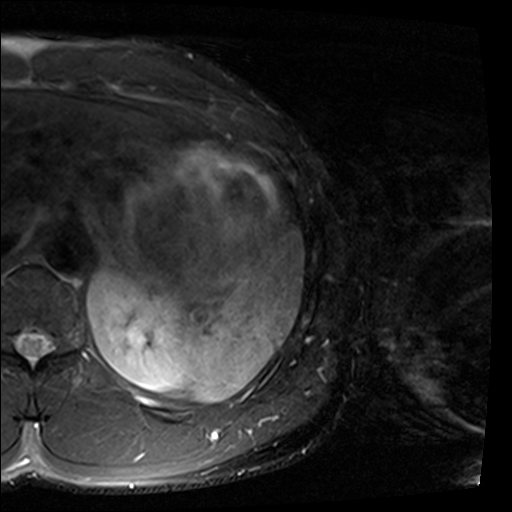
[im 16/32]
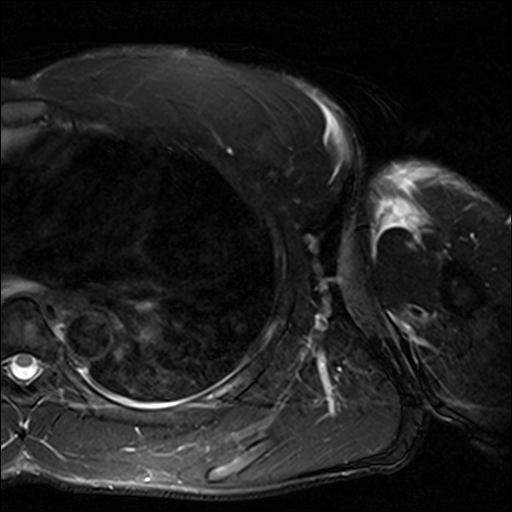
[im 32/32]
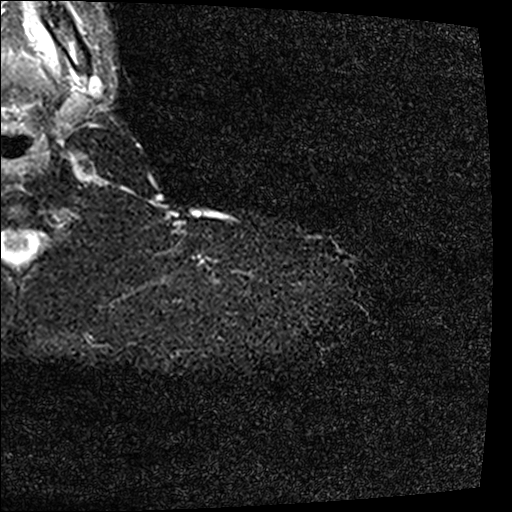

[Series 5: T1 · coronal · 4.0mm · 0.51mm/px · 3 of 25 slices shown (2 of 2)]
[im 1/25]
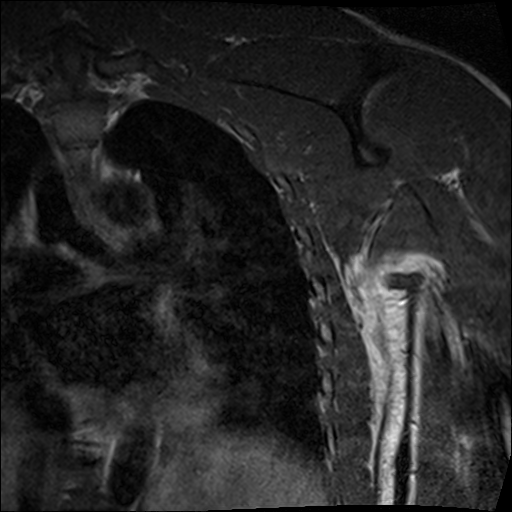
[im 13/25]
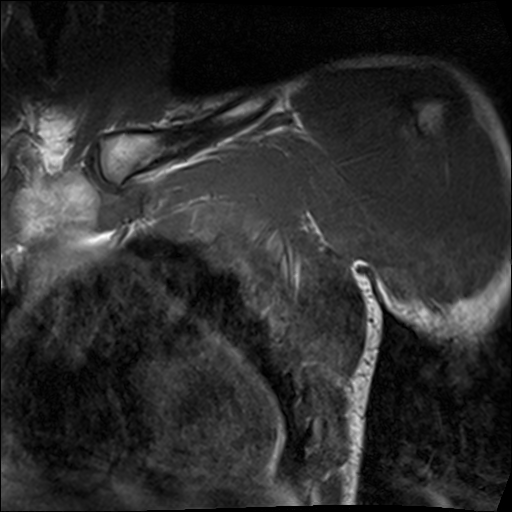
[im 25/25]
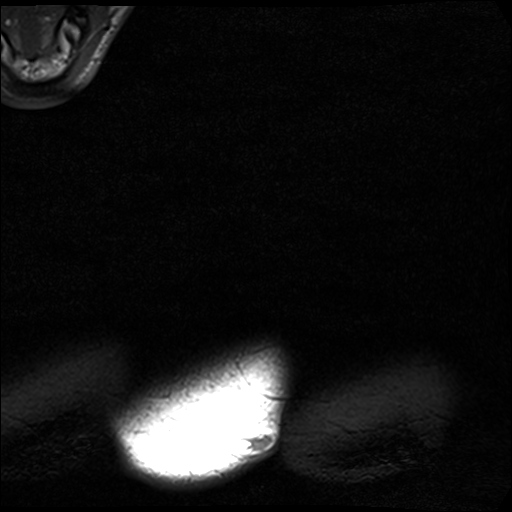

[Series 6: STIR · coronal · 4.0mm · 0.51mm/px · 3 of 25 slices shown]
[im 1/25]
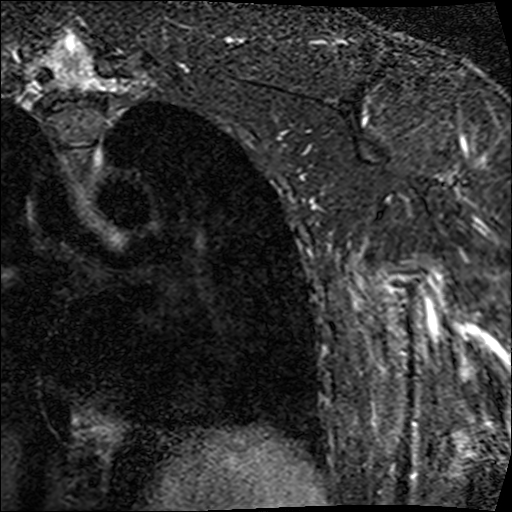
[im 13/25]
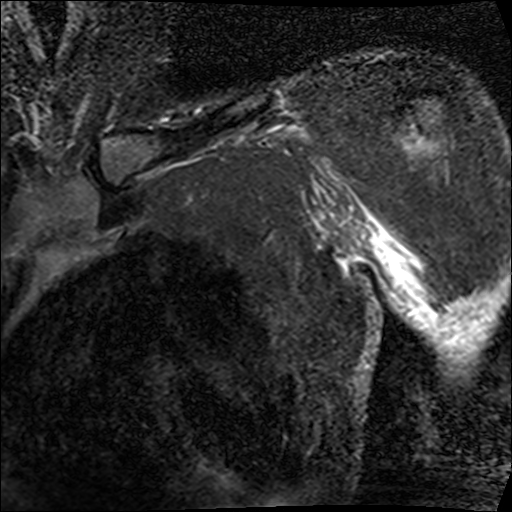
[im 25/25]
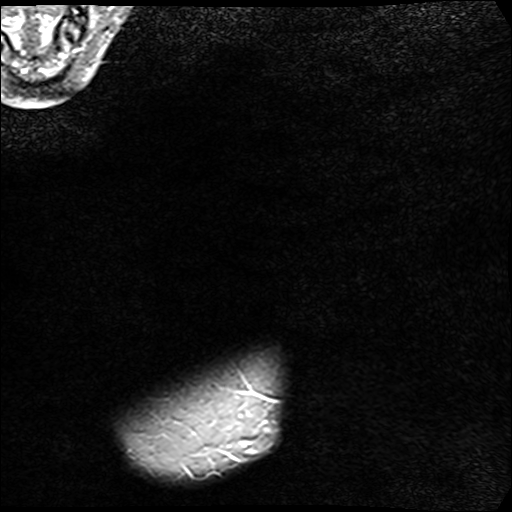

[Series 7: PD fat-sat · sagittal · 6.0mm · 0.47mm/px · 4 of 32 slices shown]
[im 1/32]
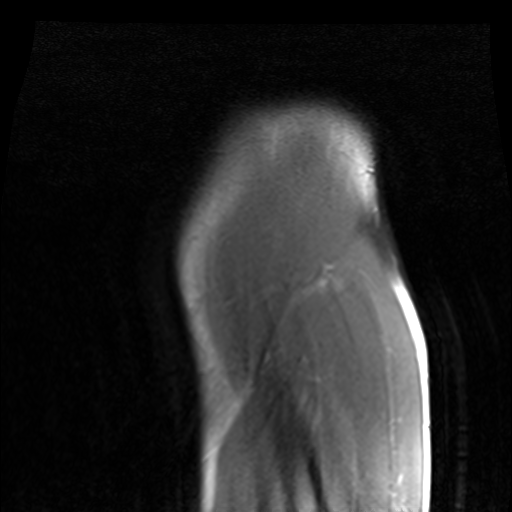
[im 11/32]
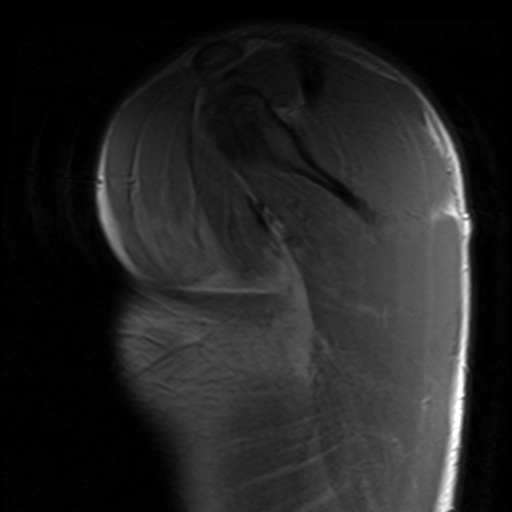
[im 21/32]
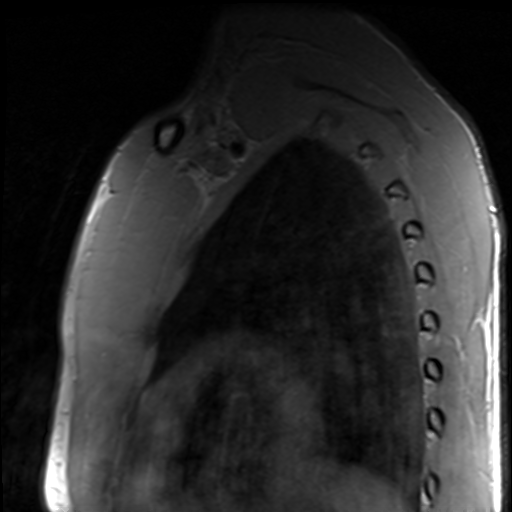
[im 32/32]
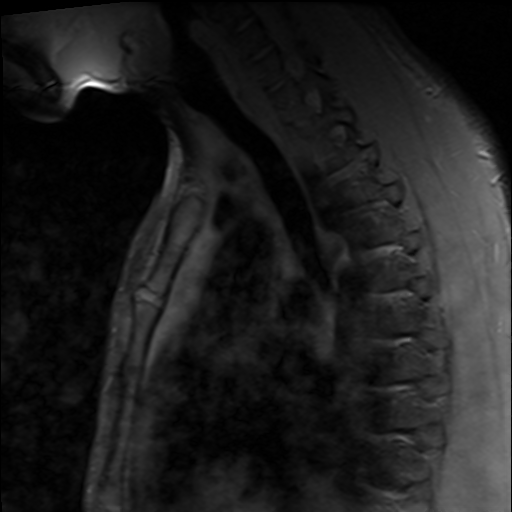

[16 of 16 positions shown; findings below may reference images not displayed]

FINDINGS: The pectoralis tendon is torn from its humeral attachment site.
Maximum retraction is estimated at 3 cm. There is associated fluid/
edema/ hemorrhage tracking back into the musculotendinous region. No
acute bony injury is identified. The other shoulder and chest wall
musculature appears normal.
IMPRESSION: Pectoralis tendon avulsion from its humeral attachment site.

## 2017-05-01 ENCOUNTER — Encounter (HOSPITAL_BASED_OUTPATIENT_CLINIC_OR_DEPARTMENT_OTHER): Payer: Self-pay | Admitting: Emergency Medicine

## 2017-05-01 ENCOUNTER — Emergency Department (HOSPITAL_BASED_OUTPATIENT_CLINIC_OR_DEPARTMENT_OTHER)
Admission: EM | Admit: 2017-05-01 | Discharge: 2017-05-01 | Disposition: A | Discharge status: Home / Self Care | Payer: BC Managed Care – PPO | Attending: Physician Assistant | Admitting: Physician Assistant

## 2017-05-01 DIAGNOSIS — Z79899 Other long term (current) drug therapy: Secondary | ICD-10-CM | POA: Diagnosis not present

## 2017-05-01 DIAGNOSIS — Y92410 Unspecified street and highway as the place of occurrence of the external cause: Secondary | ICD-10-CM | POA: Diagnosis not present

## 2017-05-01 DIAGNOSIS — S40812A Abrasion of left upper arm, initial encounter: Secondary | ICD-10-CM | POA: Diagnosis not present

## 2017-05-01 DIAGNOSIS — S40811A Abrasion of right upper arm, initial encounter: Secondary | ICD-10-CM | POA: Diagnosis not present

## 2017-05-01 DIAGNOSIS — Y9389 Activity, other specified: Secondary | ICD-10-CM | POA: Insufficient documentation

## 2017-05-01 DIAGNOSIS — S80812A Abrasion, left lower leg, initial encounter: Secondary | ICD-10-CM | POA: Diagnosis not present

## 2017-05-01 DIAGNOSIS — Y998 Other external cause status: Secondary | ICD-10-CM | POA: Diagnosis not present

## 2017-05-01 DIAGNOSIS — S80811A Abrasion, right lower leg, initial encounter: Secondary | ICD-10-CM | POA: Diagnosis not present

## 2017-05-01 DIAGNOSIS — Z23 Encounter for immunization: Secondary | ICD-10-CM | POA: Insufficient documentation

## 2017-05-01 DIAGNOSIS — R52 Pain, unspecified: Secondary | ICD-10-CM | POA: Diagnosis present

## 2017-05-01 MED ORDER — IBUPROFEN 800 MG PO TABS: 800.0000 mg | ORAL_TABLET | Freq: Three times a day (TID) | ORAL | 0 refills | Status: DC

## 2017-05-01 MED ORDER — CYCLOBENZAPRINE HCL 10 MG PO TABS
10.0000 mg | ORAL_TABLET | Freq: Two times a day (BID) | ORAL | 0 refills | Status: DC | PRN
Start: 2017-05-01 — End: 2020-04-25

## 2017-05-01 MED ORDER — TETANUS-DIPHTH-ACELL PERTUSSIS 5-2.5-18.5 LF-MCG/0.5 IM SUSP
0.5000 mL | Freq: Once | INTRAMUSCULAR | Status: AC
  Administered 2017-05-01: 0.5 mL via INTRAMUSCULAR
  Filled 2017-05-01: qty 0.5
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
## 2020-04-25 ENCOUNTER — Ambulatory Visit (HOSPITAL_COMMUNITY)
Admission: EM | Admit: 2020-04-25 | Discharge: 2020-04-25 | Disposition: A | Discharge status: Home / Self Care | Payer: 59 | Attending: Internal Medicine | Admitting: Internal Medicine

## 2020-04-25 ENCOUNTER — Other Ambulatory Visit: Payer: Self-pay

## 2020-04-25 ENCOUNTER — Encounter (HOSPITAL_COMMUNITY): Payer: Self-pay

## 2020-04-25 DIAGNOSIS — S86001A Unspecified injury of right Achilles tendon, initial encounter: Secondary | ICD-10-CM | POA: Diagnosis not present

## 2020-04-25 MED ORDER — NAPROXEN 500 MG PO TABS: 500.0000 mg | ORAL_TABLET | Freq: Two times a day (BID) | ORAL | 0 refills | Status: AC | PRN

## 2020-04-25 NOTE — ED Provider Notes (Signed)
[place]    CSN: [id number] Arrival date & time: 04/25/20  1653   History   Chief Complaint Chief Complaint  Patient presents with  . Foot Pain    HPI Dan Keller is a 29 y.o. male otherwise healthy presents to urgent care after injury at football practice. Patient states he was getting ready to take off running when he heard a pop and felt pain in the right heel. Patient with difficulty bearing weight due to pain. He denies any weakness, numbness or tingling.    Past Medical History:  Diagnosis Date  . Headache    "occasionally - not recently" 02/16/16  . History of bronchitis     There are no problems to display for this patient.   Past Surgical History:  Procedure Laterality Date  . ANTERIOR CRUCIATE LIGAMENT REPAIR Left   . HUMERUS FRACTURE SURGERY Right   . PECTORALIS TENDON REPAIR Left 02/17/2016   Procedure: LEFT PECTORALIS TENDON REPAIR;  Surgeon: Justice Britain, MD;  Location: [facility];  Service: Orthopedics;  Laterality: Left;       Home Medications    Prior to Admission medications   Medication Sig Start Date End Date Taking? Authorizing Provider  naproxen (NAPROSYN) 500 MG tablet Take 1 tablet (500 mg total) by mouth 2 (two) times daily as needed. 04/25/20 04/25/21  Rudolpho Sevin, NP    Family History Family History  Problem Relation Age of Onset  . Healthy Mother   . Healthy Father     Social History Social History   Tobacco Use  . Smoking status: Never Smoker  . Smokeless tobacco: Never Used  Substance Use Topics  . Alcohol use: No  . Drug use: Yes    Types: Marijuana    Comment: occassionally     Allergies   Patient has no known allergies.   Review of Systems As stated in hpi, otherwise negative   Physical Exam Triage Vital Signs ED Triage Vitals  Enc Vitals Group     BP 04/25/20 1710 139/78     Pulse Rate 04/25/20 1707 73     Resp 04/25/20 1707 16     Temp 04/25/20 1707 98 F (36.7 C)     Temp src --       SpO2 04/25/20 1707 98 %     Weight --      Height --      Head Circumference --      Peak Flow --      Pain Score 04/25/20 1708 7     Pain Loc --      Pain Edu? --      Excl. in [place]? --    No data found.  Updated Vital Signs BP 139/78   Pulse 73   Temp 98 F (36.7 C)   Resp 16   SpO2 98%     Physical Exam Constitutional:      General: He is not in acute distress.    Appearance: Normal appearance.  Musculoskeletal:     Comments: Tenderness on palpation of posterior right leg just below calf down to achilles tendon. No swelling or bruising noted. Negative Thompson test. 2+ DP pulse c <2s cap refill on affected foot.   Neurological:     Mental Status: He is alert.    UC Treatments / Results  Labs (all labs ordered are listed, but only abnormal results are displayed) Labs Reviewed - No data to display  EKG  Radiology No results  found.    Final Clinical Impressions(s) / UC Diagnoses   Injury of Achilles tendon -Thompson test negative though cannot r/o partial tear or tendinopathy -Will place in CAM boot for comfort, crutches -Ice frequently, Naproxen prn -Pt to call ortho in am to arrange follow-up. He has been seen at [facility] ortho previously. If no appt available there, numbers given to ortho urgent care  Final diagnoses:  Injury of Achilles tendon, right, initial encounter     Discharge Instructions     Use boot for comfort. Ice frequently in the next 1 to 2 days. Call [facility] in the a.m. to schedule appointment. Contact also given for orthopedic urgent care if [facility] is unable to see you. Take Naproxen as needed for pain.     ED Prescriptions    Medication Sig Dispense Auth. Provider   naproxen (NAPROSYN) 500 MG tablet Take 1 tablet (500 mg total) by mouth 2 (two) times daily as needed. 30 tablet Rolla Etienne, NP     PDMP not reviewed this encounter.   Rolla Etienne, NP 04/26/20 0021

## 2020-04-25 NOTE — ED Triage Notes (Signed)
Pt c/o right foot pain after hearing something pop at football practice

## 2020-04-25 NOTE — Discharge Instructions (Addendum)
Use boot for comfort. Ice frequently in the next 1 to 2 days. Call [facility] in the a.m. to schedule appointment. Contact also given for orthopedic urgent care if [facility] is unable to see you. Take Naproxen as needed for pain.

## 2024-01-10 ENCOUNTER — Ambulatory Visit (INDEPENDENT_AMBULATORY_CARE_PROVIDER_SITE_OTHER): Payer: Medicaid Other | Admitting: Family

## 2024-01-10 ENCOUNTER — Encounter: Payer: Self-pay | Admitting: Family

## 2024-01-10 VITALS — BP 120/70 | Ht 71.0 in | Wt 199.8 lb

## 2024-01-10 DIAGNOSIS — Z13220 Encounter for screening for lipoid disorders: Secondary | ICD-10-CM

## 2024-01-10 DIAGNOSIS — Z0000 Encounter for general adult medical examination without abnormal findings: Secondary | ICD-10-CM | POA: Diagnosis not present

## 2024-01-10 DIAGNOSIS — K4090 Unilateral inguinal hernia, without obstruction or gangrene, not specified as recurrent: Secondary | ICD-10-CM

## 2024-01-10 DIAGNOSIS — Z114 Encounter for screening for human immunodeficiency virus [HIV]: Secondary | ICD-10-CM

## 2024-01-10 DIAGNOSIS — Z1159 Encounter for screening for other viral diseases: Secondary | ICD-10-CM

## 2024-01-10 LAB — CBC WITH DIFFERENTIAL/PLATELET
Basophils Absolute: 0 10*3/uL (ref 0.0–0.1)
Basophils Relative: 0.2 % (ref 0.0–3.0)
Eosinophils Absolute: 0.2 10*3/uL (ref 0.0–0.7)
Eosinophils Relative: 7.2 % — ABNORMAL HIGH (ref 0.0–5.0)
HCT: 44 % (ref 39.0–52.0)
Hemoglobin: 15.1 g/dL (ref 13.0–17.0)
Lymphocytes Relative: 45 % (ref 12.0–46.0)
Lymphs Abs: 1.5 10*3/uL (ref 0.7–4.0)
MCHC: 34.2 g/dL (ref 30.0–36.0)
MCV: 88.4 fl (ref 78.0–100.0)
Monocytes Absolute: 0.3 10*3/uL (ref 0.1–1.0)
Monocytes Relative: 10.3 % (ref 3.0–12.0)
Neutro Abs: 1.3 10*3/uL — ABNORMAL LOW (ref 1.4–7.7)
Neutrophils Relative %: 37.3 % — ABNORMAL LOW (ref 43.0–77.0)
Platelets: 257 10*3/uL (ref 150.0–400.0)
RBC: 4.98 Mil/uL (ref 4.22–5.81)
RDW: 13.5 % (ref 11.5–15.5)
WBC: 3.4 10*3/uL — ABNORMAL LOW (ref 4.0–10.5)

## 2024-01-10 LAB — COMPREHENSIVE METABOLIC PANEL
ALT: 27 U/L (ref 0–53)
AST: 21 U/L (ref 0–37)
Albumin: 4.4 g/dL (ref 3.5–5.2)
Alkaline Phosphatase: 61 U/L (ref 39–117)
BUN: 12 mg/dL (ref 6–23)
CO2: 32 meq/L (ref 19–32)
Calcium: 9.2 mg/dL (ref 8.4–10.5)
Chloride: 105 meq/L (ref 96–112)
Creatinine, Ser: 1.16 mg/dL (ref 0.40–1.50)
GFR: 83.69 mL/min (ref >60.00)
Glucose, Bld: 75 mg/dL (ref 70–99)
Potassium: 4.1 meq/L (ref 3.5–5.1)
Sodium: 142 meq/L (ref 135–145)
Total Bilirubin: 0.8 mg/dL (ref 0.2–1.2)
Total Protein: 7 g/dL (ref 6.0–8.3)

## 2024-01-10 LAB — LIPID PANEL
Cholesterol: 207 mg/dL — ABNORMAL HIGH (ref 0–200)
HDL: 52.2 mg/dL (ref >39.00)
LDL Cholesterol: 135 mg/dL — ABNORMAL HIGH (ref 0–99)
NonHDL: 154.81
Total CHOL/HDL Ratio: 4
Triglycerides: 101 mg/dL (ref 0.0–149.0)
VLDL: 20.2 mg/dL (ref 0.0–40.0)
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
## 2024-01-11 LAB — HIV ANTIBODY (ROUTINE TESTING W REFLEX): HIV 1&2 Ab, 4th Generation: NONREACTIVE

## 2024-01-11 LAB — HEPATITIS C ANTIBODY: Hepatitis C Ab: NONREACTIVE

## 2024-01-15 ENCOUNTER — Encounter: Payer: Self-pay | Admitting: Family
